# Patient Record
Sex: Female | Born: 1975 | Hispanic: Yes | Marital: Married | Smoking: Never smoker
Health system: Southern US, Community
[De-identification: ages and names within clinical notes are randomized; demographics above are authoritative.]

## PROBLEM LIST (undated history)

## (undated) DIAGNOSIS — D649 Anemia, unspecified: Secondary | ICD-10-CM

## (undated) DIAGNOSIS — Z789 Other specified health status: Secondary | ICD-10-CM

## (undated) HISTORY — PX: NO PAST SURGERIES: SHX2092

---

## 2010-03-20 ENCOUNTER — Emergency Department: Payer: Self-pay | Admitting: Emergency Medicine

## 2010-03-23 ENCOUNTER — Other Ambulatory Visit: Payer: Self-pay

## 2010-11-09 ENCOUNTER — Ambulatory Visit: Payer: Self-pay | Admitting: Family Medicine

## 2010-12-25 ENCOUNTER — Encounter: Payer: Self-pay | Admitting: Maternal & Fetal Medicine

## 2011-03-22 ENCOUNTER — Encounter: Payer: Self-pay | Admitting: Obstetrics and Gynecology

## 2011-04-27 ENCOUNTER — Inpatient Hospital Stay: Payer: Self-pay

## 2011-04-27 LAB — CBC WITH DIFFERENTIAL/PLATELET
Basophil #: 0 10*3/uL (ref 0.0–0.1)
Eosinophil #: 0 10*3/uL (ref 0.0–0.7)
HCT: 33.3 % — ABNORMAL LOW (ref 35.0–47.0)
HGB: 11.3 g/dL — ABNORMAL LOW (ref 12.0–16.0)
Lymphocyte #: 1.5 10*3/uL (ref 1.0–3.6)
Lymphocyte %: 15.1 %
MCH: 31.7 pg (ref 26.0–34.0)
MCHC: 33.8 g/dL (ref 32.0–36.0)
MCV: 94 fL (ref 80–100)
Monocyte %: 6 %
Neutrophil #: 7.8 10*3/uL — ABNORMAL HIGH (ref 1.4–6.5)
Neutrophil %: 78.1 %
Platelet: 190 10*3/uL (ref 150–440)
RBC: 3.55 10*6/uL — ABNORMAL LOW (ref 3.80–5.20)

## 2013-05-18 ENCOUNTER — Encounter: Payer: Self-pay | Admitting: Obstetrics and Gynecology

## 2013-07-27 ENCOUNTER — Encounter: Payer: Self-pay | Admitting: Maternal & Fetal Medicine

## 2013-10-02 ENCOUNTER — Inpatient Hospital Stay: Payer: Self-pay | Admitting: Obstetrics and Gynecology

## 2013-10-02 LAB — CBC WITH DIFFERENTIAL/PLATELET
BASOS ABS: 0 10*3/uL (ref 0.0–0.1)
Basophil %: 0.4 %
EOS ABS: 0 10*3/uL (ref 0.0–0.7)
EOS PCT: 0.3 %
HCT: 35.1 % (ref 35.0–47.0)
HGB: 11.9 g/dL — ABNORMAL LOW (ref 12.0–16.0)
Lymphocyte #: 1.8 10*3/uL (ref 1.0–3.6)
Lymphocyte %: 15.8 %
MCH: 31.9 pg (ref 26.0–34.0)
MCHC: 33.9 g/dL (ref 32.0–36.0)
MCV: 94 fL (ref 80–100)
Monocyte #: 0.7 x10 3/mm (ref 0.2–0.9)
Monocyte %: 6.5 %
Neutrophil #: 8.7 10*3/uL — ABNORMAL HIGH (ref 1.4–6.5)
Neutrophil %: 77 %
PLATELETS: 207 10*3/uL (ref 150–440)
RBC: 3.73 10*6/uL — ABNORMAL LOW (ref 3.80–5.20)
RDW: 13.7 % (ref 11.5–14.5)
WBC: 11.3 10*3/uL — AB (ref 3.6–11.0)

## 2013-10-03 LAB — HEMOGLOBIN: HGB: 11.5 g/dL — AB (ref 12.0–16.0)

## 2014-08-17 NOTE — H&P (Signed)
L&D Evaluation:  History:   HPI 39yo [id number] with LMP of 08/14/10 & EDD of 05/21/11 & L 13 2/7 wks with EDD of 05/15/11 with PNC at [facility] significant for Tdap UTD, AMA, 2 prior children with pyloric stenois. Dates predicted by Chart indicate 05/15/11 as her EDD.    Presents with contractions    Patient's Medical History AMA    Patient's Surgical History Previous C-Section    Medications Pre Natal Vitamins    Allergies NKDA    Social History none    Family History Non-Contributory   ROS:   ROS All systems were reviewed.  HEENT, CNS, GI, GU, Respiratory, CV, Renal and Musculoskeletal systems were found to be normal.   Exam:   Vital Signs stable    General no apparent distress    Mental Status clear    Chest clear    Heart normal sinus rhythm, no murmur/gallop/rubs    Abdomen gravid, non-tender    Estimated Fetal Weight Average for gestational age    Back no CVAT    Edema 1+    Reflexes 1+    Clonus negative    Pelvic 5/80/vtx-2    Mebranes Intact    FHT normal rate with no decels    Ucx regular    Skin dry    Lymph no lymphadenopathy   Impression:   Impression active labor   Plan:   Plan monitor contractions and for cervical change, Antic SVD   Electronic Signatures: Sharee PimpleJones, Sharanda Shinault W (CNM)  (Signed 18-Jan-13 12:48)  Authored: L&D Evaluation   Last Updated: 18-Jan-13 12:48 by Sharee PimpleJones, Myrl Lazarus W (CNM)

## 2014-08-17 NOTE — H&P (Signed)
L&D Evaluation:  History:  HPI G5P3  edc 10/11/13  admitted active labor cervix 7 cm /90/0 VTX SROM   Patient's Medical History No Chronic Illness   Patient's Surgical History none   Medications Pre Natal Vitamins   Allergies NKDA   Social History none   Family History Non-Contributory   ROS:  ROS All systems were reviewed.  HEENT, CNS, GI, GU, Respiratory, CV, Renal and Musculoskeletal systems were found to be normal., AMA   Exam:  Vital Signs stable   General no apparent distress   Mental Status clear   Abdomen gravid, non-tender   Estimated Fetal Weight Average for gestational age   Fetal Position vtx   Pelvic 7 cm/90/0 vtx   Mebranes Ruptured, SROM 0830   FHT normal rate with no decels   Ucx regular   Impression:  Impression active labor   Plan:  Plan pitocin augmentation   Electronic Signatures: Schermerhorn, Ihor Austinhomas J (MD)  (Signed 26-Jun-15 11:50)  Authored: L&D Evaluation   Last Updated: 26-Jun-15 11:50 by Suzy BouchardSchermerhorn, Thomas J (MD)

## 2015-04-10 NOTE — L&D Delivery Note (Signed)
Delivery Note At 0345 on 03/08/16 a viable, healthy and female  was delivered via SVD (Presentation: LOA;  ).  APGAR:8/9 , ; weight  .   Placenta status:intact  , .  Cord:3 v delayed cord clamping   with the following complications: .none  Anesthesia:  Cle  Rapid progression  Episiotomy:  none Lacerations: none  Suture Repair: n/a Est. Blood Loss (mL):  200cc  Mom to postpartum.  Baby to Couplet care / Skin to Skin.  Kelly Lawson 03/08/2016, 4:01 AM

## 2015-08-29 ENCOUNTER — Other Ambulatory Visit: Payer: Self-pay | Admitting: Family Medicine

## 2015-08-29 DIAGNOSIS — Z3481 Encounter for supervision of other normal pregnancy, first trimester: Secondary | ICD-10-CM

## 2015-08-30 LAB — OB RESULTS CONSOLE ABO/RH: RH TYPE: POSITIVE

## 2015-08-30 LAB — OB RESULTS CONSOLE ANTIBODY SCREEN: ANTIBODY SCREEN: NEGATIVE

## 2015-08-30 LAB — OB RESULTS CONSOLE HIV ANTIBODY (ROUTINE TESTING): HIV: NONREACTIVE

## 2015-08-30 LAB — OB RESULTS CONSOLE GC/CHLAMYDIA
Chlamydia: NEGATIVE
Gonorrhea: NEGATIVE

## 2015-08-30 LAB — OB RESULTS CONSOLE RPR: RPR: NONREACTIVE

## 2015-09-01 ENCOUNTER — Ambulatory Visit
Admission: RE | Admit: 2015-09-01 | Discharge: 2015-09-01 | Disposition: A | Discharge status: Home / Self Care | Payer: BLUE CROSS/BLUE SHIELD | Source: Ambulatory Visit | Attending: Family Medicine | Admitting: Family Medicine

## 2015-09-01 DIAGNOSIS — Z3481 Encounter for supervision of other normal pregnancy, first trimester: Secondary | ICD-10-CM | POA: Diagnosis not present

## 2015-09-01 DIAGNOSIS — Z3A12 12 weeks gestation of pregnancy: Secondary | ICD-10-CM | POA: Insufficient documentation

## 2015-10-03 ENCOUNTER — Other Ambulatory Visit: Payer: Self-pay | Admitting: Family Medicine

## 2015-10-03 DIAGNOSIS — O09522 Supervision of elderly multigravida, second trimester: Secondary | ICD-10-CM

## 2015-10-24 ENCOUNTER — Ambulatory Visit
Admission: RE | Admit: 2015-10-24 | Discharge: 2015-10-24 | Disposition: A | Discharge status: Home / Self Care | Payer: BLUE CROSS/BLUE SHIELD | Source: Ambulatory Visit | Attending: Obstetrics & Gynecology | Admitting: Obstetrics & Gynecology

## 2015-10-24 VITALS — BP 120/65 | HR 95 | Temp 97.9°F | Resp 17 | Ht 66.0 in | Wt 159.0 lb

## 2015-10-24 DIAGNOSIS — O09529 Supervision of elderly multigravida, unspecified trimester: Secondary | ICD-10-CM

## 2015-10-24 DIAGNOSIS — O09522 Supervision of elderly multigravida, second trimester: Secondary | ICD-10-CM

## 2015-10-24 DIAGNOSIS — O09523 Supervision of elderly multigravida, third trimester: Secondary | ICD-10-CM | POA: Insufficient documentation

## 2015-10-24 HISTORY — DX: Other specified health status: Z78.9

## 2016-01-02 ENCOUNTER — Other Ambulatory Visit: Payer: Self-pay | Admitting: Obstetrics & Gynecology

## 2016-01-02 ENCOUNTER — Ambulatory Visit
Admission: RE | Admit: 2016-01-02 | Discharge: 2016-01-02 | Disposition: A | Discharge status: Home / Self Care | Payer: BLUE CROSS/BLUE SHIELD | Source: Ambulatory Visit | Attending: Obstetrics & Gynecology | Admitting: Obstetrics & Gynecology

## 2016-01-02 DIAGNOSIS — O09523 Supervision of elderly multigravida, third trimester: Secondary | ICD-10-CM

## 2016-01-02 DIAGNOSIS — O09522 Supervision of elderly multigravida, second trimester: Secondary | ICD-10-CM | POA: Diagnosis not present

## 2016-01-02 DIAGNOSIS — Z3A30 30 weeks gestation of pregnancy: Secondary | ICD-10-CM | POA: Insufficient documentation

## 2016-01-26 ENCOUNTER — Other Ambulatory Visit: Payer: Self-pay | Admitting: *Deleted

## 2016-01-26 DIAGNOSIS — O09529 Supervision of elderly multigravida, unspecified trimester: Secondary | ICD-10-CM

## 2016-01-30 ENCOUNTER — Ambulatory Visit
Admission: RE | Admit: 2016-01-30 | Discharge: 2016-01-30 | Disposition: A | Discharge status: Home / Self Care | Payer: BLUE CROSS/BLUE SHIELD | Source: Ambulatory Visit | Attending: Maternal & Fetal Medicine | Admitting: Maternal & Fetal Medicine

## 2016-01-30 DIAGNOSIS — O09523 Supervision of elderly multigravida, third trimester: Secondary | ICD-10-CM | POA: Diagnosis present

## 2016-01-30 DIAGNOSIS — Z3A33 33 weeks gestation of pregnancy: Secondary | ICD-10-CM | POA: Diagnosis not present

## 2016-01-30 DIAGNOSIS — O09522 Supervision of elderly multigravida, second trimester: Secondary | ICD-10-CM

## 2016-01-30 DIAGNOSIS — O09529 Supervision of elderly multigravida, unspecified trimester: Secondary | ICD-10-CM

## 2016-02-02 ENCOUNTER — Other Ambulatory Visit: Payer: Self-pay | Admitting: *Deleted

## 2016-02-02 DIAGNOSIS — O09523 Supervision of elderly multigravida, third trimester: Secondary | ICD-10-CM

## 2016-02-06 ENCOUNTER — Ambulatory Visit
Admission: RE | Admit: 2016-02-06 | Discharge: 2016-02-06 | Disposition: A | Discharge status: Home / Self Care | Payer: BLUE CROSS/BLUE SHIELD | Source: Ambulatory Visit | Attending: Obstetrics & Gynecology | Admitting: Obstetrics & Gynecology

## 2016-02-06 ENCOUNTER — Other Ambulatory Visit: Payer: Self-pay | Admitting: *Deleted

## 2016-02-06 ENCOUNTER — Other Ambulatory Visit: Payer: BLUE CROSS/BLUE SHIELD

## 2016-02-06 DIAGNOSIS — O09523 Supervision of elderly multigravida, third trimester: Secondary | ICD-10-CM

## 2016-02-06 DIAGNOSIS — Z3A35 35 weeks gestation of pregnancy: Secondary | ICD-10-CM | POA: Insufficient documentation

## 2016-02-06 DIAGNOSIS — O4103X0 Oligohydramnios, third trimester, not applicable or unspecified: Secondary | ICD-10-CM

## 2016-02-06 DIAGNOSIS — O09522 Supervision of elderly multigravida, second trimester: Secondary | ICD-10-CM

## 2016-02-06 DIAGNOSIS — IMO0001 Reserved for inherently not codable concepts without codable children: Secondary | ICD-10-CM

## 2016-02-06 DIAGNOSIS — O3663X0 Maternal care for excessive fetal growth, third trimester, not applicable or unspecified: Secondary | ICD-10-CM | POA: Insufficient documentation

## 2016-02-06 NOTE — Progress Notes (Signed)
MFM NST Note  NST/AFI done at 34 4/7 weeks due to AMA, fetal macrosomia  NST: Baseline 140's/mod var/+accels/ no decels Toco: quiet  Reactive NST  Cephalic, AFI 8.2cm.   Given low-normal fluid repeat NST/AFI scheduled for 02/09/16.  Results and plan d/w Ms. Olesky with the aide of a Spanish language interpreter.   Janeann ForehandS. Lataja Newland, MD

## 2016-02-09 ENCOUNTER — Other Ambulatory Visit (HOSPITAL_COMMUNITY): Payer: BLUE CROSS/BLUE SHIELD

## 2016-02-09 ENCOUNTER — Ambulatory Visit
Admission: RE | Admit: 2016-02-09 | Discharge: 2016-02-09 | Disposition: A | Discharge status: Home / Self Care | Payer: BLUE CROSS/BLUE SHIELD | Source: Ambulatory Visit | Attending: Obstetrics and Gynecology | Admitting: Obstetrics and Gynecology

## 2016-02-09 DIAGNOSIS — O09523 Supervision of elderly multigravida, third trimester: Secondary | ICD-10-CM | POA: Diagnosis present

## 2016-02-09 DIAGNOSIS — Z3A35 35 weeks gestation of pregnancy: Secondary | ICD-10-CM | POA: Insufficient documentation

## 2016-02-09 NOTE — Progress Notes (Signed)
NST: FHR 130's  mod variability + 15x15 accels; no decels No significant uterine activity REACTIVE NST  Ceph, AFI 9.2 cm F/up NST/AFI 11/9.

## 2016-02-13 ENCOUNTER — Other Ambulatory Visit: Payer: Self-pay | Admitting: *Deleted

## 2016-02-13 ENCOUNTER — Other Ambulatory Visit: Payer: BLUE CROSS/BLUE SHIELD

## 2016-02-13 DIAGNOSIS — O09522 Supervision of elderly multigravida, second trimester: Secondary | ICD-10-CM

## 2016-02-13 DIAGNOSIS — O09523 Supervision of elderly multigravida, third trimester: Secondary | ICD-10-CM

## 2016-02-15 LAB — OB RESULTS CONSOLE GBS: STREP GROUP B AG: NEGATIVE

## 2016-02-16 ENCOUNTER — Other Ambulatory Visit: Payer: BLUE CROSS/BLUE SHIELD

## 2016-02-16 ENCOUNTER — Ambulatory Visit
Admission: RE | Admit: 2016-02-16 | Discharge: 2016-02-16 | Disposition: A | Discharge status: Home / Self Care | Payer: BLUE CROSS/BLUE SHIELD | Source: Ambulatory Visit | Attending: Maternal & Fetal Medicine | Admitting: Maternal & Fetal Medicine

## 2016-02-16 DIAGNOSIS — O09523 Supervision of elderly multigravida, third trimester: Secondary | ICD-10-CM

## 2016-02-16 DIAGNOSIS — O09522 Supervision of elderly multigravida, second trimester: Secondary | ICD-10-CM

## 2016-02-20 ENCOUNTER — Other Ambulatory Visit: Payer: Self-pay | Admitting: *Deleted

## 2016-02-20 DIAGNOSIS — O09529 Supervision of elderly multigravida, unspecified trimester: Secondary | ICD-10-CM

## 2016-02-23 ENCOUNTER — Ambulatory Visit (HOSPITAL_BASED_OUTPATIENT_CLINIC_OR_DEPARTMENT_OTHER)
Admission: RE | Admit: 2016-02-23 | Discharge: 2016-02-23 | Disposition: A | Discharge status: Home / Self Care | Payer: BLUE CROSS/BLUE SHIELD | Source: Ambulatory Visit | Attending: Obstetrics and Gynecology | Admitting: Obstetrics and Gynecology

## 2016-02-23 ENCOUNTER — Other Ambulatory Visit: Payer: Self-pay | Admitting: *Deleted

## 2016-02-23 ENCOUNTER — Ambulatory Visit
Admission: RE | Admit: 2016-02-23 | Discharge: 2016-02-23 | Disposition: A | Discharge status: Home / Self Care | Payer: BLUE CROSS/BLUE SHIELD | Source: Ambulatory Visit | Attending: Obstetrics and Gynecology | Admitting: Obstetrics and Gynecology

## 2016-02-23 VITALS — BP 127/79 | HR 91 | Temp 98.1°F | Resp 18 | Wt 176.0 lb

## 2016-02-23 DIAGNOSIS — O09523 Supervision of elderly multigravida, third trimester: Secondary | ICD-10-CM | POA: Insufficient documentation

## 2016-02-23 DIAGNOSIS — O3663X0 Maternal care for excessive fetal growth, third trimester, not applicable or unspecified: Secondary | ICD-10-CM | POA: Diagnosis not present

## 2016-02-23 DIAGNOSIS — O09529 Supervision of elderly multigravida, unspecified trimester: Secondary | ICD-10-CM | POA: Diagnosis not present

## 2016-02-23 DIAGNOSIS — Z3A37 37 weeks gestation of pregnancy: Secondary | ICD-10-CM | POA: Diagnosis not present

## 2016-02-23 NOTE — Progress Notes (Signed)
NST for AMA and macrosomia: 6767w0d FHR 130's with shift to 140's Mod variability 15x15 accels present decels absent No significant uterine activity REACTIVE NST  Ceph, AFI 8.1 cm, EFW 3441 grams (70th%) Continue weekly NST/AFI and deliver between 39-40 weeks per prior recommendation of Dr. Fayrene FearingJames.

## 2016-02-27 ENCOUNTER — Other Ambulatory Visit: Payer: Self-pay | Admitting: *Deleted

## 2016-02-27 DIAGNOSIS — O09529 Supervision of elderly multigravida, unspecified trimester: Secondary | ICD-10-CM

## 2016-03-01 ENCOUNTER — Encounter: Payer: Self-pay | Admitting: *Deleted

## 2016-03-01 ENCOUNTER — Inpatient Hospital Stay
Admission: EM | Admit: 2016-03-01 | Discharge: 2016-03-01 | Disposition: A | Discharge status: Home / Self Care | Payer: BLUE CROSS/BLUE SHIELD | Attending: Obstetrics & Gynecology | Admitting: Obstetrics & Gynecology

## 2016-03-01 DIAGNOSIS — Z3A38 38 weeks gestation of pregnancy: Secondary | ICD-10-CM | POA: Diagnosis present

## 2016-03-01 DIAGNOSIS — Z3493 Encounter for supervision of normal pregnancy, unspecified, third trimester: Secondary | ICD-10-CM | POA: Insufficient documentation

## 2016-03-01 DIAGNOSIS — O09523 Supervision of elderly multigravida, third trimester: Secondary | ICD-10-CM

## 2016-03-01 NOTE — Discharge Instructions (Signed)
Next NST due March 08, 2016 at 1000 a.m. At [facility], 3rd floor [facility].

## 2016-03-01 NOTE — Progress Notes (Signed)
TRIAGE VISIT with NST    Subjective:   Kelly Lawson is a 40 y.o. female. She is at 4672w0d gestation here for NST. PNC at [facility] significant for LMP of 06/09/15 with EDD of 03/15/16 and C/W EDD by US with EDD of 03/10/16.  PMH, PSH, POBH and problem list reviewed Medications and allergies reviewed.   Past Medical History:  Diagnosis Date  . Medical history non-contributory    Past Surgical History:  Procedure Laterality Date  . NO PAST SURGERIES    No family history on file.  Social History   Social History  . Marital status: Married    Spouse name: N/A  . Number of children: N/A  . Years of education: N/A   Occupational History  . Not on file.   Social History Main Topics  . Smoking status: Never Smoker  . Smokeless tobacco: Never Used  . Alcohol use No  . Drug use: No  . Sexual activity: Yes   Other Topics Concern  . Not on file   Social History Narrative  . No narrative on file   Objective:   Vitals:   03/01/16 1016  BP: 119/79  Pulse: 84  Resp: 18  Temp: 98 F (36.7 C)   NST reviewed with multiple accels 15 x 15 BPM.   Assessment:   Pregnancy at 4572w0d with AMA NST reactive  Plan:  FU weekly for NST as scheduled   Patient expresses understanding of information provided and plan of care.

## 2016-03-05 ENCOUNTER — Ambulatory Visit: Payer: BLUE CROSS/BLUE SHIELD

## 2016-03-05 ENCOUNTER — Ambulatory Visit
Admission: RE | Admit: 2016-03-05 | Discharge: 2016-03-05 | Disposition: A | Discharge status: Home / Self Care | Payer: BLUE CROSS/BLUE SHIELD | Source: Ambulatory Visit | Attending: Obstetrics & Gynecology | Admitting: Obstetrics & Gynecology

## 2016-03-05 VITALS — BP 120/75 | HR 85 | Temp 97.7°F | Resp 18 | Wt 178.0 lb

## 2016-03-05 DIAGNOSIS — O09523 Supervision of elderly multigravida, third trimester: Secondary | ICD-10-CM

## 2016-03-05 DIAGNOSIS — O09529 Supervision of elderly multigravida, unspecified trimester: Secondary | ICD-10-CM

## 2016-03-05 DIAGNOSIS — O3663X0 Maternal care for excessive fetal growth, third trimester, not applicable or unspecified: Secondary | ICD-10-CM

## 2016-03-05 NOTE — Progress Notes (Signed)
NST: Baseline 135, moderate variability, reactive, no decels Toco No contractions  See US report Cephalic and normal AFI.  Pt scheduled for IOL with [facility] this week. Pt seen with an interpreter  Jaelen Gellerman, [place] A, MD

## 2016-03-07 ENCOUNTER — Other Ambulatory Visit: Payer: Self-pay | Admitting: Obstetrics and Gynecology

## 2016-03-07 ENCOUNTER — Inpatient Hospital Stay
Admission: EM | Admit: 2016-03-07 | Discharge: 2016-03-09 | DRG: 775 | Disposition: A | Discharge status: Home / Self Care | Payer: BLUE CROSS/BLUE SHIELD | Source: Intra-hospital | Attending: Obstetrics and Gynecology | Admitting: Obstetrics and Gynecology

## 2016-03-07 DIAGNOSIS — O3663X0 Maternal care for excessive fetal growth, third trimester, not applicable or unspecified: Secondary | ICD-10-CM | POA: Diagnosis present

## 2016-03-07 DIAGNOSIS — Z3A39 39 weeks gestation of pregnancy: Secondary | ICD-10-CM | POA: Diagnosis not present

## 2016-03-07 HISTORY — DX: Anemia, unspecified: D64.9

## 2016-03-07 LAB — CBC
HCT: 34.5 % — ABNORMAL LOW (ref 35.0–47.0)
HEMOGLOBIN: 11.9 g/dL — AB (ref 12.0–16.0)
MCH: 32.3 pg (ref 26.0–34.0)
MCHC: 34.5 g/dL (ref 32.0–36.0)
MCV: 93.8 fL (ref 80.0–100.0)
Platelets: 179 10*3/uL (ref 150–440)
RBC: 3.68 MIL/uL — ABNORMAL LOW (ref 3.80–5.20)
RDW: 13.9 % (ref 11.5–14.5)
WBC: 8.9 10*3/uL (ref 3.6–11.0)

## 2016-03-07 LAB — TYPE AND SCREEN
ABO/RH(D): O POS
Antibody Screen: NEGATIVE

## 2016-03-07 MED ORDER — LACTATED RINGERS IV SOLN
INTRAVENOUS | Status: DC
  Administered 2016-03-07: 22:00:00 via INTRAVENOUS

## 2016-03-07 MED ORDER — SOD CITRATE-CITRIC ACID 500-334 MG/5ML PO SOLN: 30.0000 mL | ORAL | Status: DC | PRN

## 2016-03-07 MED ORDER — TERBUTALINE SULFATE 1 MG/ML IJ SOLN: 0.2500 mg | Freq: Once | INTRAMUSCULAR | Status: DC | PRN

## 2016-03-07 MED ORDER — ONDANSETRON HCL 4 MG/2ML IJ SOLN: 4.0000 mg | Freq: Four times a day (QID) | INTRAMUSCULAR | Status: DC | PRN

## 2016-03-07 MED ORDER — AMMONIA AROMATIC IN INHA
RESPIRATORY_TRACT | Status: AC
  Filled 2016-03-07: qty 10

## 2016-03-07 MED ORDER — OXYTOCIN BOLUS FROM INFUSION: 500.0000 mL | Freq: Once | INTRAVENOUS | Status: DC

## 2016-03-07 MED ORDER — OXYTOCIN 10 UNIT/ML IJ SOLN
INTRAMUSCULAR | Status: AC
  Filled 2016-03-07: qty 2

## 2016-03-07 MED ORDER — LACTATED RINGERS IV SOLN
500.0000 mL | INTRAVENOUS | Status: DC | PRN
  Administered 2016-03-08 (×2): 500 mL via INTRAVENOUS

## 2016-03-07 MED ORDER — ACETAMINOPHEN 325 MG PO TABS: 650.0000 mg | ORAL_TABLET | ORAL | Status: DC | PRN

## 2016-03-07 MED ORDER — LIDOCAINE HCL (PF) 1 % IJ SOLN
30.0000 mL | INTRAMUSCULAR | Status: DC | PRN
  Filled 2016-03-07: qty 30

## 2016-03-07 MED ORDER — OXYTOCIN 40 UNITS IN LACTATED RINGERS INFUSION - SIMPLE MED
2.5000 [IU]/h | INTRAVENOUS | Status: DC
  Filled 2016-03-07: qty 1000

## 2016-03-07 MED ORDER — BUTORPHANOL TARTRATE 1 MG/ML IJ SOLN
1.0000 mg | INTRAMUSCULAR | Status: DC | PRN
  Administered 2016-03-08 (×3): 1 mg via INTRAVENOUS
  Filled 2016-03-07 (×3): qty 1

## 2016-03-07 MED ORDER — DINOPROSTONE 10 MG VA INST
10.0000 mg | VAGINAL_INSERT | Freq: Once | VAGINAL | Status: AC
  Administered 2016-03-07: 10 mg via VAGINAL
  Filled 2016-03-07: qty 1

## 2016-03-07 MED ORDER — ZOLPIDEM TARTRATE 5 MG PO TABS
5.0000 mg | ORAL_TABLET | Freq: Every evening | ORAL | Status: DC | PRN
  Administered 2016-03-07: 5 mg via ORAL
  Filled 2016-03-07: qty 1

## 2016-03-07 MED ORDER — MISOPROSTOL 200 MCG PO TABS
ORAL_TABLET | ORAL | Status: AC
  Filled 2016-03-07: qty 4

## 2016-03-07 MED ORDER — LIDOCAINE HCL (PF) 1 % IJ SOLN
INTRAMUSCULAR | Status: AC
  Filled 2016-03-07: qty 30

## 2016-03-07 NOTE — H&P (Signed)
  OB ADMISSION/ HISTORY & PHYSICAL:  Admission Date: 03/07/16 Admit Diagnosis: IOL at 39+4 weeks for AMA and suspected LGA  Kelly Lawson is a 40 y.o. female [id number] at 39+4 weeks presenting for AMA and suspected LGA per MFM recommendation.  EFW on 02/23/16 was 3441 grams.    Prenatal History: [id number]   EDC : 03/10/2016, by 12 weeks US  Prenatal care at [facility] Prenatal course complicated by LGA, AMA, anemia, glucose intolerance with normal 3 hour GTT  Prenatal Labs: ABO, Rh:  O Positive Antibody:  Negative Rubella:   Immune Varicella: Immune RPR:   NR HBsAg:   Negative HIV:   NR GTT: 1 hour: 146, 3 hour GTT: WNL GBS:   Negative   Flu and Tdap UTD  Medical / Surgical History :  Past medical history:  Past Medical History:  Diagnosis Date  . Medical history non-contributory      Past surgical history:  Past Surgical History:  Procedure Laterality Date  . NO PAST SURGERIES      Family History: No family history on file.   Social History:  reports that she has never smoked. She has never used smokeless tobacco. She reports that she does not drink alcohol or use drugs.   Allergies: Patient has no known allergies.    Current Medications at time of admission:  Prior to Admission medications   Medication Sig Start Date End Date Taking? Authorizing Provider  ferrous sulfate 325 (65 FE) MG tablet Take 325 mg by mouth daily with breakfast.    Historical Provider, MD  Prenatal Vit-Fe Fumarate-FA (PRENATAL MULTIVITAMIN) TABS tablet Take 1 tablet by mouth daily at 12 noon.    Historical Provider, MD     Review of Systems: Active FM +Bh ctxs No LOF  / SROM  No bloody show    Physical Exam:  VS: Last menstrual period 06/09/2015.  General: alert and oriented, appears calm Heart: RRR Lungs: Clear lung fields Abdomen: Gravid, soft and non-tender, non-distended / uterus: gravid, non-tender Extremities: no edema  Genitalia / VE:   per [facility]  1-2cm/40-50%/-3/soft/vtx/posterior Reactive NST on 03/05/16 with MFM  Assessment: 39+[redacted] weeks gestation Induction stage of labor AMA LGA GBS Negative   Plan:  [facility]    - Routine Labor and Delivery Orders    - Cervidil vaginally x 1     - Stadol PRN    - Epidural in active labor  2. GBS Negative     - No prophylaxis indicated 3. Postpartum:    - Contraception: OCPs    - Breast/Formula 4. Anticipate NSVD   - Proven pelvis: unknown  Dr. Feliberto GottronSchermerhorn notified of admission / plan of care  Carlean JewsMeredith Titiana Severa, CNM

## 2016-03-08 ENCOUNTER — Inpatient Hospital Stay: Payer: BLUE CROSS/BLUE SHIELD | Admitting: Anesthesiology

## 2016-03-08 ENCOUNTER — Encounter: Payer: Self-pay | Admitting: Anesthesiology

## 2016-03-08 MED ORDER — LACTATED RINGERS IV SOLN: 500.0000 mL | Freq: Once | INTRAVENOUS | Status: DC

## 2016-03-08 MED ORDER — DIBUCAINE 1 % RE OINT: 1.0000 "application " | TOPICAL_OINTMENT | RECTAL | Status: DC | PRN

## 2016-03-08 MED ORDER — BUPIVACAINE HCL (PF) 0.25 % IJ SOLN
INTRAMUSCULAR | Status: DC | PRN
  Administered 2016-03-08: 5 mL via EPIDURAL

## 2016-03-08 MED ORDER — FENTANYL 2.5 MCG/ML W/ROPIVACAINE 0.2% IN NS 100 ML EPIDURAL INFUSION (ARMC-ANES)
EPIDURAL | Status: AC
  Filled 2016-03-08: qty 100

## 2016-03-08 MED ORDER — TETANUS-DIPHTH-ACELL PERTUSSIS 5-2.5-18.5 LF-MCG/0.5 IM SUSP: 0.5000 mL | Freq: Once | INTRAMUSCULAR | Status: DC

## 2016-03-08 MED ORDER — SENNOSIDES-DOCUSATE SODIUM 8.6-50 MG PO TABS
2.0000 | ORAL_TABLET | ORAL | Status: DC
  Administered 2016-03-09: 2 via ORAL
  Filled 2016-03-08: qty 2

## 2016-03-08 MED ORDER — PHENYLEPHRINE 40 MCG/ML (10ML) SYRINGE FOR IV PUSH (FOR BLOOD PRESSURE SUPPORT)
80.0000 ug | PREFILLED_SYRINGE | INTRAVENOUS | Status: DC | PRN
  Filled 2016-03-08: qty 5

## 2016-03-08 MED ORDER — COCONUT OIL OIL: 1.0000 "application " | TOPICAL_OIL | Status: DC | PRN

## 2016-03-08 MED ORDER — ZOLPIDEM TARTRATE 5 MG PO TABS: 5.0000 mg | ORAL_TABLET | Freq: Every evening | ORAL | Status: DC | PRN

## 2016-03-08 MED ORDER — DIPHENHYDRAMINE HCL 50 MG/ML IJ SOLN: 12.5000 mg | INTRAMUSCULAR | Status: DC | PRN

## 2016-03-08 MED ORDER — ACETAMINOPHEN 325 MG PO TABS: 650.0000 mg | ORAL_TABLET | ORAL | Status: DC | PRN

## 2016-03-08 MED ORDER — OXYCODONE-ACETAMINOPHEN 5-325 MG PO TABS
1.0000 | ORAL_TABLET | Freq: Four times a day (QID) | ORAL | Status: DC | PRN
  Administered 2016-03-08 (×2): 1 via ORAL
  Filled 2016-03-08 (×2): qty 1

## 2016-03-08 MED ORDER — EPHEDRINE 5 MG/ML INJ
10.0000 mg | INTRAVENOUS | Status: DC | PRN
  Filled 2016-03-08: qty 2

## 2016-03-08 MED ORDER — SIMETHICONE 80 MG PO CHEW: 80.0000 mg | CHEWABLE_TABLET | ORAL | Status: DC | PRN

## 2016-03-08 MED ORDER — IBUPROFEN 600 MG PO TABS
ORAL_TABLET | ORAL | Status: AC
  Administered 2016-03-08: 600 mg via ORAL
  Filled 2016-03-08: qty 1

## 2016-03-08 MED ORDER — PRENATAL MULTIVITAMIN CH
1.0000 | ORAL_TABLET | Freq: Every day | ORAL | Status: DC
  Administered 2016-03-08 – 2016-03-09 (×2): 1 via ORAL
  Filled 2016-03-08 (×2): qty 1

## 2016-03-08 MED ORDER — IBUPROFEN 600 MG PO TABS
600.0000 mg | ORAL_TABLET | Freq: Four times a day (QID) | ORAL | Status: DC
  Administered 2016-03-08 – 2016-03-09 (×6): 600 mg via ORAL
  Filled 2016-03-08 (×5): qty 1

## 2016-03-08 MED ORDER — ONDANSETRON HCL 4 MG/2ML IJ SOLN: 4.0000 mg | INTRAMUSCULAR | Status: DC | PRN

## 2016-03-08 MED ORDER — ONDANSETRON HCL 4 MG PO TABS: 4.0000 mg | ORAL_TABLET | ORAL | Status: DC | PRN

## 2016-03-08 MED ORDER — WITCH HAZEL-GLYCERIN EX PADS: 1.0000 "application " | MEDICATED_PAD | CUTANEOUS | Status: DC | PRN

## 2016-03-08 MED ORDER — BENZOCAINE-MENTHOL 20-0.5 % EX AERO: 1.0000 "application " | INHALATION_SPRAY | CUTANEOUS | Status: DC | PRN

## 2016-03-08 MED ORDER — DIPHENHYDRAMINE HCL 25 MG PO CAPS: 25.0000 mg | ORAL_CAPSULE | Freq: Four times a day (QID) | ORAL | Status: DC | PRN

## 2016-03-08 NOTE — Anesthesia Preprocedure Evaluation (Signed)
Anesthesia Evaluation  Patient identified by MRN, date of birth, ID band Patient awake    Reviewed: Allergy & Precautions, NPO status , Patient's Chart, lab work & pertinent test results, reviewed documented beta blocker date and time   Airway Mallampati: II  TM Distance: >3 FB     Dental  (+) Chipped   Pulmonary           Cardiovascular      Neuro/Psych    GI/Hepatic   Endo/Other    Renal/GU      Musculoskeletal   Abdominal   Peds  Hematology  (+) anemia ,   Anesthesia Other Findings   Reproductive/Obstetrics                             Anesthesia Physical Anesthesia Plan  ASA: II  Anesthesia Plan: Epidural   Post-op Pain Management:    Induction:   Airway Management Planned:   Additional Equipment:   Intra-op Plan:   Post-operative Plan:   Informed Consent: I have reviewed the patients History and Physical, chart, labs and discussed the procedure including the risks, benefits and alternatives for the proposed anesthesia with the patient or authorized representative who has indicated his/her understanding and acceptance.     Plan Discussed with: CRNA  Anesthesia Plan Comments:         Anesthesia Quick Evaluation  

## 2016-03-08 NOTE — Discharge Summary (Signed)
Obstetric Discharge Summary Reason for Admission: induction of labor and suspected LGA Prenatal Procedures: Growth US and AFI, NST Intrapartum Procedures: spontaneous vaginal delivery Postpartum Procedures: none Complications-Operative and Postpartum: none Hemoglobin  Date Value Ref Range Status  03/07/2016 11.9 (L) 12.0 - 16.0 g/dL Final   HGB  Date Value Ref Range Status  10/03/2013 11.5 (L) 12.0 - 16.0 g/dL Final   HCT  Date Value Ref Range Status  03/07/2016 34.5 (L) 35.0 - 47.0 % Final  10/02/2013 35.1 35.0 - 47.0 % Final    Physical Exam:  General: alert and cooperative Lochia: appropriate Uterine Fundus: firm Incision: n/a DVT Evaluation: No evidence of DVT seen on physical exam.  Discharge Diagnoses: Term Pregnancy-delivered  Discharge Information: Date: 03/09/16 Activity: pelvic rest Diet: routine Medications: Ibuprofen , Prenatal vitamin, FE Condition: stable Instructions: refer to practice specific booklet Discharge to: home  Follow-up Information    [facility]. Schedule an appointment as soon as possible for a visit in 6 week(s).   Why:  Postpartum visit - wants birth control pills  Contact information: [address] [place] [id number]          Newborn Data: Live born female  Birth Weight:  3720 grams (8#3oz) APGAR: 8,9  Breast/Formula  Home with mother.  SCHERMERHORN,THOMAS 03/08/2016, 4:07 AM   Carlean JewsMeredith Niles Ess, CNM  03/09/16

## 2016-03-08 NOTE — Progress Notes (Signed)
Pt requested epidural, Dr Maisie Fushomas in room SVE 7cm, pt compliant with instructions for epidural. Epidural placed 0333. 0335 Test dose given, pt c/o increased vaginal pressure. TJS at bs at 0336, SVE by TJS pt complete. Pt to SF and pushing instructions given.

## 2016-03-08 NOTE — H&P (Signed)
Kelly Lawson is a 40 y.o. female presenting for induction  [facility] suspected [facility]    Gravida Para Term Preterm AB Living   6 4     1 4    SAB TAB Ectopic Multiple Live Births   1       4     Past Medical History:  Diagnosis Date  . Anemia   . Medical history non-contributory    Past Surgical History:  Procedure Laterality Date  . NO PAST SURGERIES     Family History: family history is not on file. Social History:  reports that she has never smoked. She has never used smokeless tobacco. She reports that she does not drink alcohol or use drugs.     Maternal Diabetes: No Genetic Screening: Normal Maternal Ultrasounds/Referrals: Normal Fetal Ultrasounds or other Referrals:  None Maternal Substance Abuse:  No Significant Maternal Medications:  None Significant Maternal Lab Results:  None Other Comments:  None  ROS History Dilation: 5 Effacement (%): 70 Station: -2 Exam by:: SCA Blood pressure 125/84, pulse (!) 108, temperature 98.1 F (36.7 C), temperature source Oral, resp. rate 18, height 5\' 4"  (1.626 m), weight 178 lb (80.7 kg), last menstrual period 06/09/2015. Exam Physical Exam  Prenatal labs: ABO, Rh: --/--/O POS (11/29 2129) Antibody: NEG (11/29 2129) Rubella:  imm RPR: Nonreactive (05/23 0000)  HBsAg:   neg HIV: Non-reactive (05/23 0000)  GBS: Negative (11/08 0000)   Assessment/Plan: Suspected LGA  Delivery recommendation by MFM 39-40 week  Cervidil    Laural Eiland 03/08/2016, 4:05 AM

## 2016-03-08 NOTE — Anesthesia Procedure Notes (Signed)
Epidural Patient location during procedure: OB  Staffing Anesthesiologist: Berdine AddisonHOMAS, Seward Coran Performed: anesthesiologist   Preanesthetic Checklist Completed: patient identified, site marked, surgical consent, pre-op evaluation, timeout performed, IV checked, risks and benefits discussed and monitors and equipment checked  Epidural Patient position: sitting Prep: Betadine Patient monitoring: heart rate, continuous pulse ox and blood pressure Approach: midline Location: L4-L5 Injection technique: LOR saline  Needle:  Needle type: Tuohy  Needle gauge: 18 G Needle length: 9 cm and 9 Catheter type: closed end flexible Catheter size: 20 Guage Test dose: negative and 1.5% lidocaine with Epi 1:200 K  Assessment Sensory level: T10 Events: blood not aspirated, injection not painful, no injection resistance, negative IV test and no paresthesia  Additional Notes   Patient tolerated the insertion well without complications. 0322 start. [id number] test. 0336 bolus. Pt. Pushing - unable to start infusion.Reason for block:procedure for pain

## 2016-03-09 LAB — CBC
HCT: 31.1 % — ABNORMAL LOW (ref 35.0–47.0)
Hemoglobin: 10.5 g/dL — ABNORMAL LOW (ref 12.0–16.0)
MCH: 31.8 pg (ref 26.0–34.0)
MCHC: 33.8 g/dL (ref 32.0–36.0)
MCV: 94.2 fL (ref 80.0–100.0)
PLATELETS: 163 10*3/uL (ref 150–440)
RBC: 3.3 MIL/uL — ABNORMAL LOW (ref 3.80–5.20)
RDW: 14.2 % (ref 11.5–14.5)
WBC: 10.1 10*3/uL (ref 3.6–11.0)

## 2016-03-09 LAB — RPR: RPR Ser Ql: NONREACTIVE

## 2016-03-09 MED ORDER — IBUPROFEN 600 MG PO TABS: 600.0000 mg | ORAL_TABLET | Freq: Four times a day (QID) | ORAL | 0 refills | Status: AC

## 2016-03-09 MED ORDER — FERROUS SULFATE 325 (65 FE) MG PO TABS: 325.0000 mg | ORAL_TABLET | Freq: Two times a day (BID) | ORAL | 3 refills | Status: AC

## 2016-03-09 NOTE — Anesthesia Postprocedure Evaluation (Cosign Needed)
Anesthesia Post Note  Patient: Kelly Lawson  Procedure(s) Performed: * No procedures listed *  Patient location during evaluation: Mother Baby Anesthesia Type: Epidural Level of consciousness: awake, awake and alert and oriented Pain management: pain level controlled Vital Signs Assessment: post-procedure vital signs reviewed and stable Respiratory status: spontaneous breathing and respiratory function stable Cardiovascular status: blood pressure returned to baseline Postop Assessment: no headache Anesthetic complications: no    Last Vitals:  Vitals:   03/08/16 2348 03/09/16 0750  BP: (!) 94/48 111/71  Pulse: 79 71  Resp: 18 18  Temp: 36.8 C 36.6 C    Last Pain:  Vitals:   03/09/16 0750  TempSrc: Oral  PainSc:                  Vernie MurdersPope,  Jazalynn Mireles G

## 2016-03-09 NOTE — Progress Notes (Signed)
Patient discharged home with infant. Vital signs stable, bleeding within normal limits, uterus firm. Discharge instructions, prescriptions, and follow up appointment given to and reviewed with patient using interpreter. Patient verbalized understanding, all questions answered. Escorted in wheelchair by nursing.    Imagene ShellerMegan Lucresha Dismuke, RN

## 2016-03-09 NOTE — Discharge Instructions (Signed)
Parto vaginal, cuidados posteriores  (Vaginal Delivery, Care After)  Siga estas instrucciones durante las próximas semanas. Estas indicaciones le proporcionan información acerca de cómo deberá cuidarse después del parto. El médico también podrá darle instrucciones más específicas. El tratamiento ha sido planificado según las prácticas médicas actuales, pero en algunos casos pueden ocurrir problemas. Llame al médico si tiene problemas o preguntas.  QUÉ ESPERAR DESPUÉS DEL PARTO  Después de un parto vaginal, es frecuente tener lo siguiente:  · Hemorragia leve de la vagina.  · Dolor en el abdomen, la vagina y la zona de la piel entre la abertura vaginal y el ano (perineo).  · Calambres pélvicos.  · Fatiga.  INSTRUCCIONES PARA EL CUIDADO EN EL HOGAR  Medicamentos  · Tome los medicamentos de venta libre y los recetados solamente como se lo haya indicado el médico.  · Si le recetaron un antibiótico, tómelo como se lo haya indicado el médico. No interrumpa la administración del antibiótico hasta que lo haya terminado.  Conducir  · No conduzca ni opere maquinaria pesada mientras toma analgésicos recetados.  · No conduzca durante 24 horas si le administraron un sedante.  Estilo de vida  · No beba alcohol. Esto es de suma importancia si está amamantando o toma analgésicos.  · No consuma productos que contengan tabaco, incluidos cigarrillos, tabaco de mascar o cigarrillos electrónicos. Si necesita ayuda para dejar de fumar, consulte al médico.  Comida y bebida  · Beba al menos 8 vasos de 8 onzas (240 cc) de agua todos los días a menos que el médico le indique lo contrario. Si elige amamantar al bebé, quizá deba beber aún más cantidad de agua.  · Ingiera alimentos ricos en fibras todos los días. Estos alimentos pueden ayudarla a prevenir o aliviar el estreñimiento. Los alimentos ricos en fibras incluyen, entre otros:  ? Panes y cereales integrales.  ? Arroz integral.  ? Frijoles.  ? Frutas y verduras  frescas.  Actividad  · Reanude sus actividades normales como se lo haya indicado el médico. Pregúntele al médico qué actividades son seguras para usted.  · Descanse todo lo que pueda. Trate de descansar o tomar una siesta mientras el bebé está durmiendo.  · No levante objetos que pesen más de 10 libras (4,5 kg) hasta que el médico le diga que es seguro hacerlo.  · Hable con el médico sobre cuándo puede volver a tener relaciones sexuales. Esto puede depender de lo siguiente:  ? Riesgo de sufrir infecciones.  ? Velocidad de cicatrización.  ? Comodidad y deseo de tener relaciones sexuales.  Cuidados vaginales  · Si le realizaron una episiotomía o tuvo un desgarro vaginal, contrólese la zona todos los días para detectar signos de infección. Esté atenta a los siguientes signos:  ? Aumento del enrojecimiento, la hinchazón o el dolor.  ? Más líquido o sangre.  ? Calor.  ? Pus o mal olor.  · No use tampones ni se haga duchas vaginales hasta que el médico la autorice.  · Controle la sangre que elimina por la vagina para detectar coágulos. Pueden tener el aspecto de grumos de color rojo oscuro, marrón o negro.  Instrucciones generales  · Mantenga el perineo limpio y seco, como se lo haya indicado el médico.  · Use ropa cómoda y suelta.  · Cuando vaya al baño, siempre higienícese de adelante hacia atrás.  · Pregúntele al médico si puede ducharse o tomar baños de inmersión. Si se le realizó una episiotomía o tuvo un desgarro perineal durante el trabajo del parto o   ayude con las tareas del hogar y a cuidar del beb durante al menos algunos das despus de salir del hospital.  Chauncy PassyConcurra a todas las visitas de seguimiento para usted y el beb, como se lo haya indicado el mdico. Esto es importante. SOLICITE ATENCIN MDICA SI:  Tiene los  siguientes sntomas:  Secrecin vaginal que tiene mal olor.  Dificultad para orinar.  Dolor al Beatrix Shipperorinar.  Aumento o disminucin repentinos de la frecuencia con que defeca.  Ms enrojecimiento, hinchazn o dolor alrededor de la episiotoma o del desgarro vaginal.  Ms secrecin de lquido o sangre de la episiotoma o desgarro vaginal.  Pus o mal olor proveniente de la episiotoma o el desgarro vaginal.  Grant RutsFiebre.  Erupcin cutnea.  Poco inters o falta de inters en actividades que solan gustarle.  Dudas sobre su cuidado y el del beb.  Siente la episiotoma o el desgarro vaginal caliente al tacto.  La episiotoma o el desgarro vaginal se est abriendo o no Adult nurseparece cicatrizar.  Siente dolor en las Valentinemamas, o estn duras o enrojecidas.  Siente tristeza o preocupacin de forma inusual.  Siente nuseas o vomita.  Elimina cogulos grandes por la vagina. Si expulsa un cogulo sanguneo por la vagina, gurdelo para mostrrselo a su mdico. No tire la cadena sin que el mdico examine el cogulo antes.  Orina ms de lo habitual.  Se siente mareada o se desmaya.  No ha amamantado para nada y no ha tenido un perodo menstrual durante 12 semanas despus del Pyattparto.  Dej de amamantar al beb y no ha tenido su perodo menstrual durante 12 semanas despus de dejar de Museum/gallery exhibitions officeramamantar. SOLICITE ATENCIN MDICA DE INMEDIATO SI:  Tiene los siguientes sntomas:  Dolor que no desaparece o no mejora con Research scientist (medical)el medicamento.  Dolor en el pecho.  Dificultad para respirar.  Visin borrosa o Nurse, adultmanchas en la vista.  Pensamientos de autolesionarse o lesionar al beb.  Comienza a Psychiatristsentir dolor en el abdomen o en una de las piernas.  Dolor de cabeza intenso.  Se desmaya.  Tiene una hemorragia tan intensa de la vagina que empapa dos toallitas sanitarias en [place] hora. Esta informacin no tiene Theme park managercomo fin reemplazar el consejo del mdico. Asegrese de hacerle al mdico cualquier pregunta que  tenga. Document Released: 03/26/2005 Document Revised: 07/18/2015 Document Reviewed: 04/10/2015 Elsevier Interactive Patient Education  2017 [employer]   Vaginal Delivery, Care After Refer to this sheet in the next few weeks. These instructions provide you with information about caring for yourself after vaginal delivery. Your health care provider may also give you more specific instructions. Your treatment has been planned according to current medical practices, but problems sometimes occur. Call your health care provider if you have any problems or questions. What can I expect after the procedure? After vaginal delivery, it is common to have:  Some bleeding from your vagina.  Soreness in your abdomen, your vagina, and the area of skin between your vaginal opening and your anus (perineum).  Pelvic cramps.  Fatigue. Follow these instructions at home: Medicines  Take over-the-counter and prescription medicines only as told by your health care provider.  If you were prescribed an antibiotic medicine, take it as told by your health care provider. Do not stop taking the antibiotic until it is finished. Driving  Do not drive or operate heavy machinery while taking prescription pain medicine.  Do not drive for 24 hours if you received a sedative. Lifestyle  Do not drink alcohol. This is especially important if  you are breastfeeding or taking medicine to relieve pain.  Do not use tobacco products, including cigarettes, chewing tobacco, or e-cigarettes. If you need help quitting, ask your health care provider. Eating and drinking  Drink at least 8 eight-ounce glasses of water every day unless you are told not to by your health care provider. If you choose to breastfeed your baby, you may need to drink more water than this.  Eat high-fiber foods every day. These foods may help prevent or relieve constipation. High-fiber foods include:  Whole grain cereals and breads.  Brown  rice.  Beans.  Fresh fruits and vegetables. Activity  Return to your normal activities as told by your health care provider. Ask your health care provider what activities are safe for you.  Rest as much as possible. Try to rest or take a nap when your baby is sleeping.  Do not lift anything that is heavier than your baby or 10 lb (4.5 kg) until your health care provider says that it is safe.  Talk with your health care provider about when you can engage in sexual activity. This may depend on your:  Risk of infection.  Rate of healing.  Comfort and desire to engage in sexual activity. Vaginal Care  If you have an episiotomy or a vaginal tear, check the area every day for signs of infection. Check for:  More redness, swelling, or pain.  More fluid or blood.  Warmth.  Pus or a bad smell.  Do not use tampons or douches until your health care provider says this is safe.  Watch for any blood clots that may pass from your vagina. These may look like clumps of dark red, brown, or black discharge. General instructions  Keep your perineum clean and dry as told by your health care provider.  Wear loose, comfortable clothing.  Wipe from front to back when you use the toilet.  Ask your health care provider if you can shower or take a bath. If you had an episiotomy or a perineal tear during labor and delivery, your health care provider may tell you not to take baths for a certain length of time.  Wear a bra that supports your breasts and fits you well.  If possible, have someone help you with household activities and help care for your baby for at least a few days after you leave the hospital.  Keep all follow-up visits for you and your baby as told by your health care provider. This is important. Contact a health care provider if:  You have:  Vaginal discharge that has a bad smell.  Difficulty urinating.  Pain when urinating.  A sudden increase or decrease in the  frequency of your bowel movements.  More redness, swelling, or pain around your episiotomy or vaginal tear.  More fluid or blood coming from your episiotomy or vaginal tear.  Pus or a bad smell coming from your episiotomy or vaginal tear.  A fever.  A rash.  Little or no interest in activities you used to enjoy.  Questions about caring for yourself or your baby.  Your episiotomy or vaginal tear feels warm to the touch.  Your episiotomy or vaginal tear is separating or does not appear to be healing.  Your breasts are painful, hard, or turn red.  You feel unusually sad or worried.  You feel nauseous or you vomit.  You pass large blood clots from your vagina. If you pass a blood clot from your vagina, save it to  show to your health care provider. Do not flush blood clots down the toilet without having your health care provider look at them.  You urinate more than usual.  You are dizzy or light-headed.  You have not breastfed at all and you have not had a menstrual period for 12 weeks after delivery.  You have stopped breastfeeding and you have not had a menstrual period for 12 weeks after you stopped breastfeeding. Get help right away if:  You have:  Pain that does not go away or does not get better with medicine.  Chest pain.  Difficulty breathing.  Blurred vision or spots in your vision.  Thoughts about hurting yourself or your baby.  You develop pain in your abdomen or in one of your legs.  You develop a severe headache.  You faint.  You bleed from your vagina so much that you fill two sanitary pads in one hour. This information is not intended to replace advice given to you by your health care provider. Make sure you discuss any questions you have with your health care provider. Document Released: 03/23/2000 Document Revised: 09/07/2015 Document Reviewed: 04/10/2015 Elsevier Interactive Patient Education  2017 [employer].

## 2017-06-13 IMAGING — US US MFM OB FOLLOW-UP
1 series · 13 of 28 positions shown · non-contrast
Comparison: none

PATIENT INFO:

PERFORMED BY:
SERVICE(S) PROVIDED:
INDICATIONS:
30 weeks gestation of pregnancy
FETAL EVALUATION:
Num Of Fetuses:     1
Preg. Location:     Mid
Fetal Heart         168
Rate(bpm):
Cardiac Activity:   Present
Fetal Lie:          Maternal Right
Presentation:       Vertex
Placenta:           Anterior Grade 1, No previa
Amniotic Fluid
AFI FV:      Within normal limits
AFI Sum(cm)     %Tile       Largest Pocket(cm)
11.6            27
BIOMETRY:
BPD:      82.4  mm     G. Age:  33w 1d         98  %    CI:        79.01   %    70 - 86
FL/HC:       21.7  %    19.2 -
HC:      293.1  mm     G. Age:  32w 2d         73  %    HC/AC:       1.11       0.99 -
AC:      264.8  mm     G. Age:  30w 4d         55  %    FL/BPD:      77.3  %    71 - 87
FL:       63.7  mm     G. Age:  33w 0d         93  %    FL/AC:       24.1  %    20 - 24
HUM:      54.9  mm     G. Age:  32w 0d         82  %
Est. FW:    8888   gm          4 lb     80  %
GESTATIONAL AGE:
LMP:           29w 4d        Date:  06/09/15                 EDD:   03/15/16
U/S Today:     32w 2d                                        EDD:   02/25/16
Best:          30w 2d     Det. By:  Early Ultrasound         EDD:   03/10/16
(09/01/15)
ANATOMY:
Cranium:               Within Normal Limits   Diaphragm:              Within Normal Limits
Cavum:                 Suboptimal             Stomach:                Seen
Cerebellum:            Within Normal Limits   Abdomen:                Normal appearance
Posterior Fossa:       Within Normal Limits   Abdominal Wall:         Not seen today,
normal on prior
Heart:                 Normal appearance      Cord Vessels:           Seen on prior,
normal
RVOT:                  Normal appearance      Kidneys:                Normal appearance
LVOT:                  Normal appearance      Bladder:                Seen
Aortic Arch:           Normal appearance      Spine:                  Suboptimal views
Ductal Arch:           Normal appearance

[Series 1: us mfm ob follow-up · 0.25mm/px · 13 of 39 slices shown]
[im 2/39]
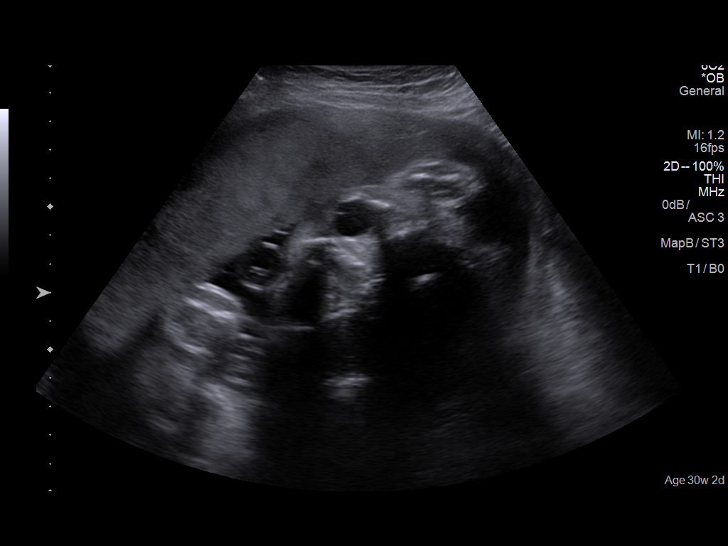
[im 5/39]
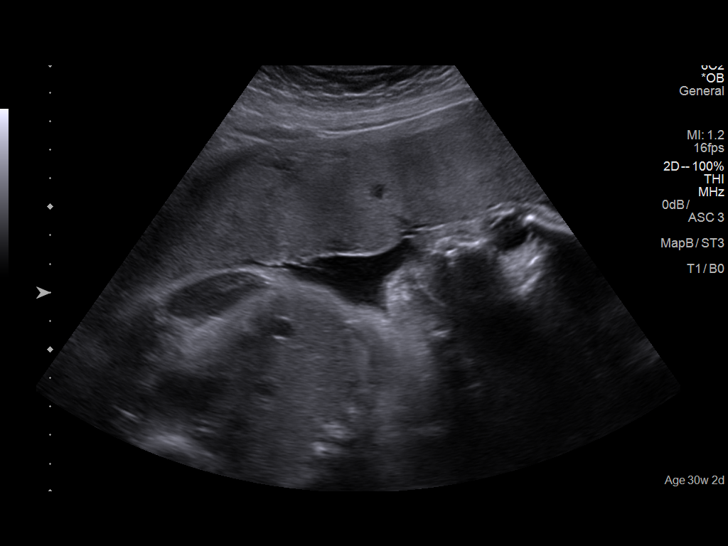
[im 8/39]
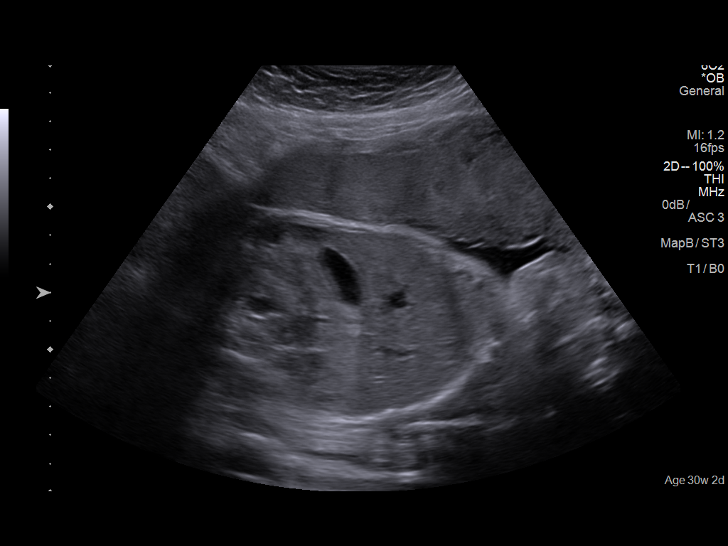
[im 10/39]
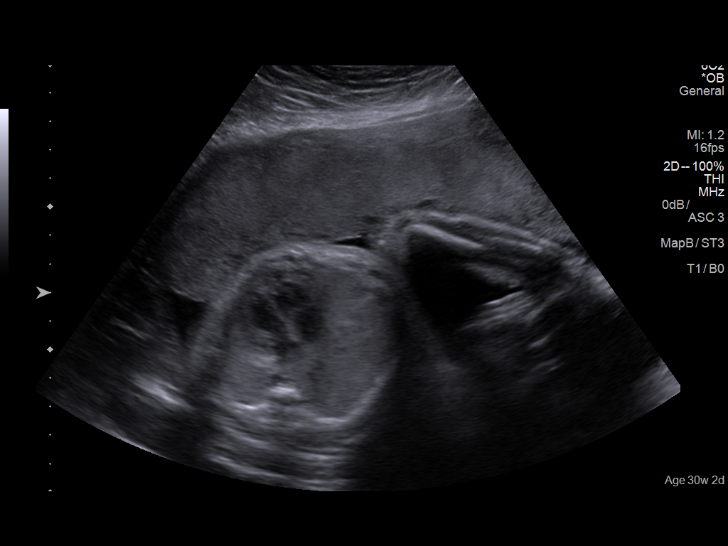
[im 13/39]
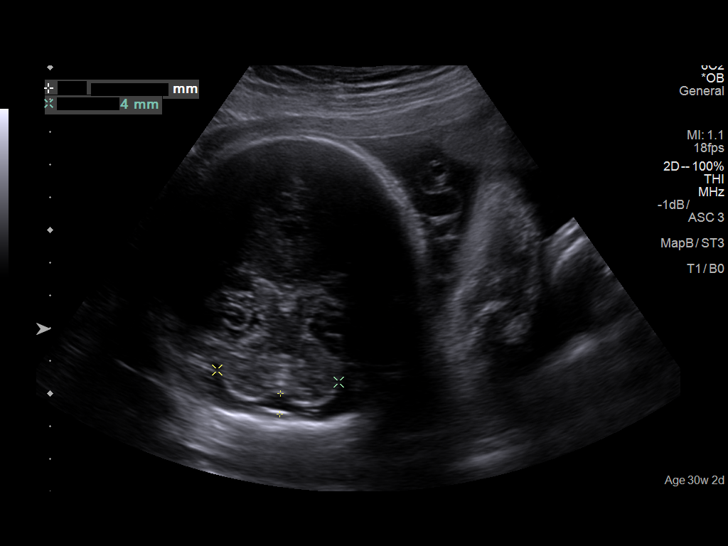
[im 16/39]
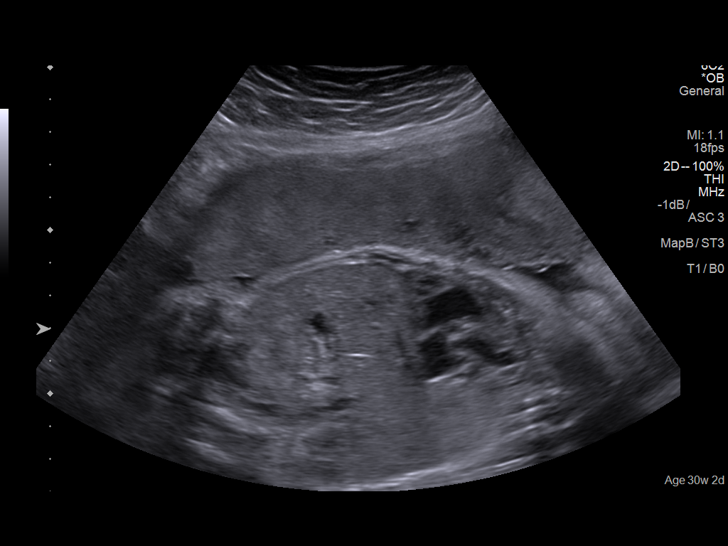
[im 20/39]
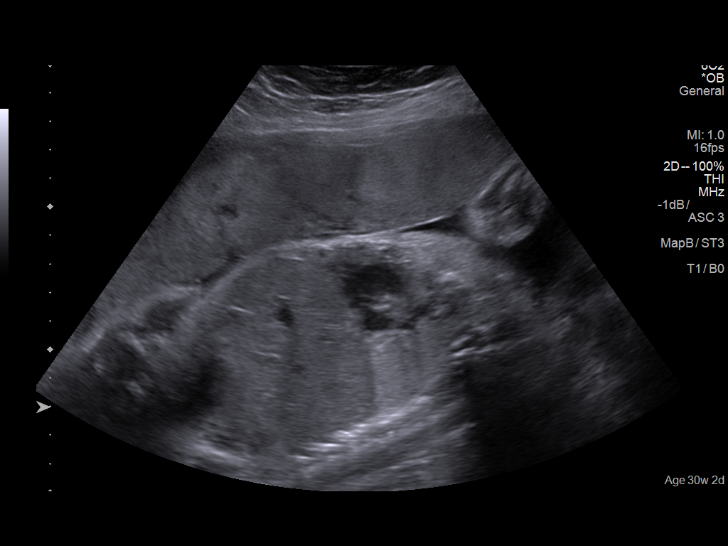
[im 23/39]
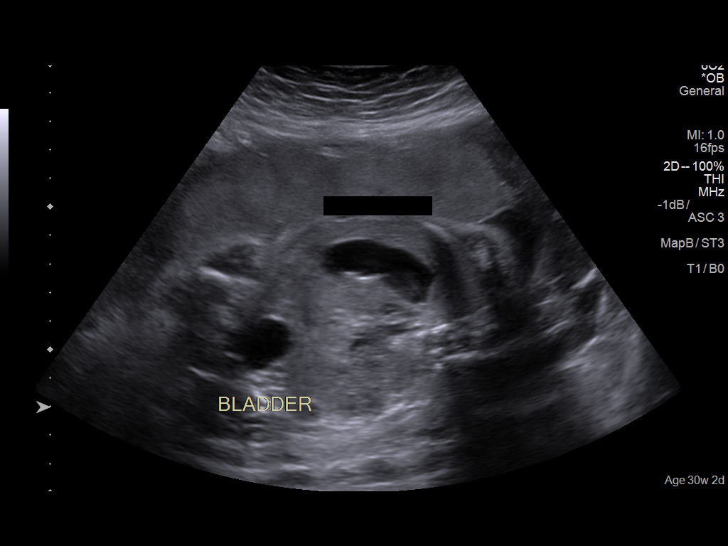
[im 26/39]
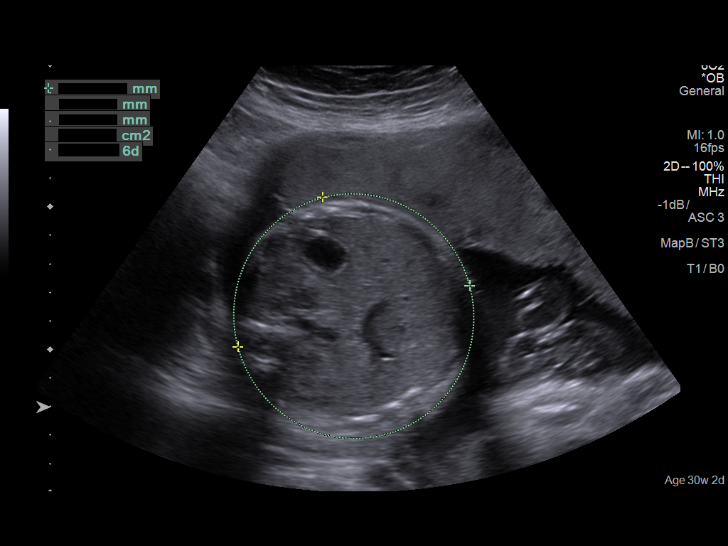
[im 29/39]
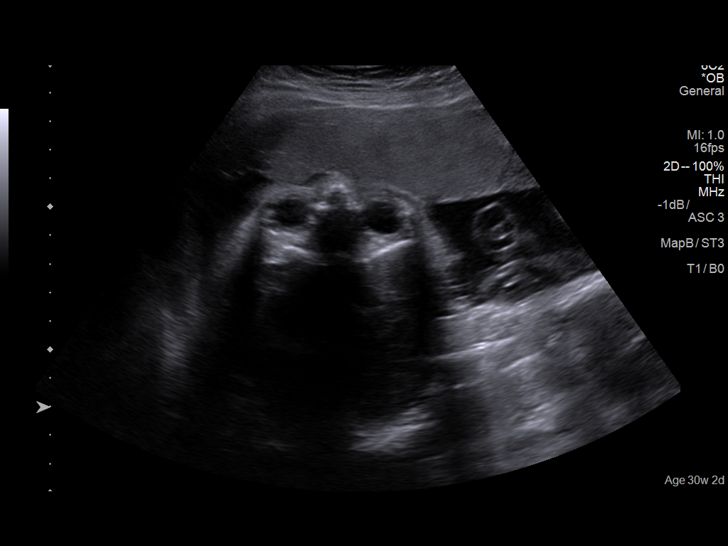
[im 31/39]
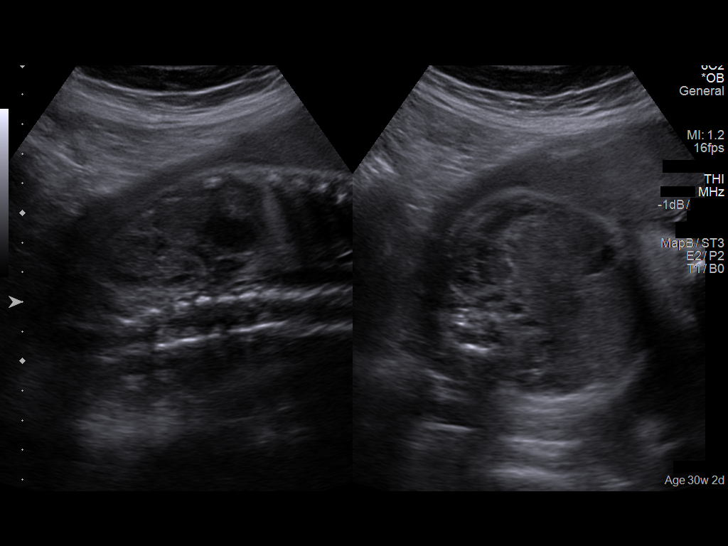
[im 34/39]
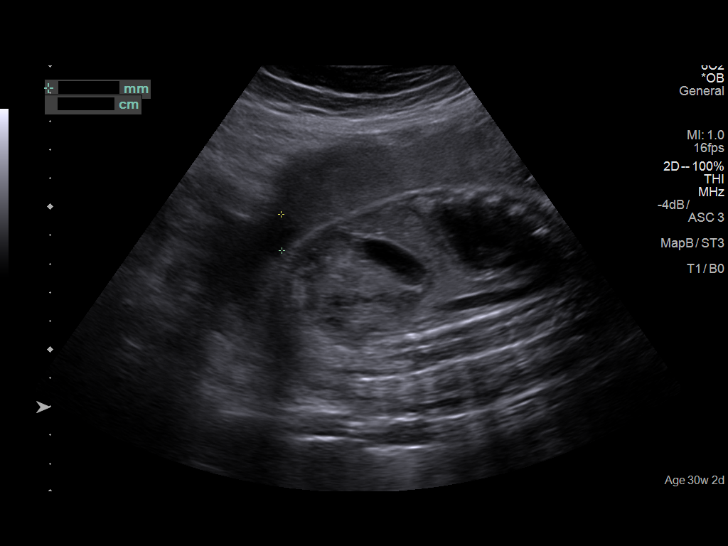
[im 37/39]
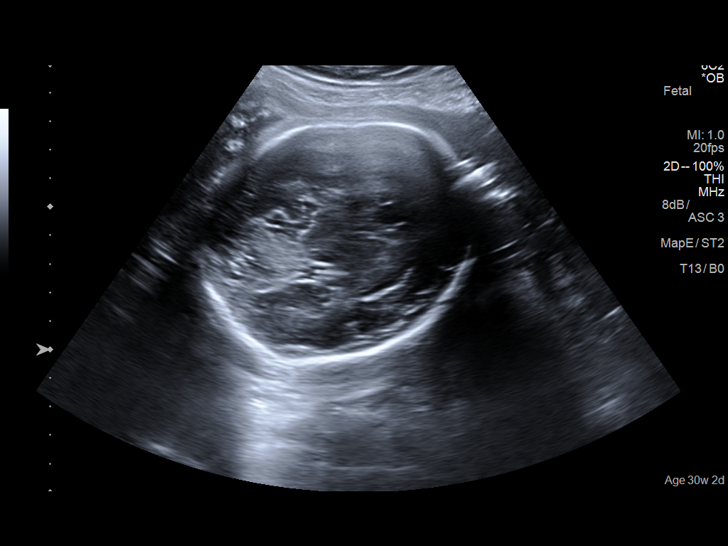

[13 of 28 positions shown; findings below may reference images not displayed]

IMPRESSION: Dear Dr. HELD,

Thank you for referring your patient to [facility] for a fetal growth evaluation.

There is a singleton gestation with normal amniotic fluid
volume.

The fetal biometry correlates with established dating.

Adequate interval growth noted.

The estimated fetal weight is at the 80th percentile. EVY.

Recommend follow-up scan for fetal growth in 4 weeks and
weekly NST/AFI or BPP starting at 36 weeks.

Thank you for allowing us to participate in your patient's care.
assistance.
